# Patient Record
Sex: Female | Born: 1965 | Race: White | Hispanic: No | Marital: Married | Smoking: Current every day smoker
Health system: Southern US, Community
[De-identification: ages and names within clinical notes are randomized; demographics above are authoritative.]

## PROBLEM LIST (undated history)

## (undated) DIAGNOSIS — I639 Cerebral infarction, unspecified: Secondary | ICD-10-CM

## (undated) DIAGNOSIS — M1990 Unspecified osteoarthritis, unspecified site: Secondary | ICD-10-CM

## (undated) DIAGNOSIS — M5412 Radiculopathy, cervical region: Secondary | ICD-10-CM

## (undated) DIAGNOSIS — I10 Essential (primary) hypertension: Secondary | ICD-10-CM

## (undated) HISTORY — PX: ABDOMINAL HYSTERECTOMY: SHX81

## (undated) HISTORY — PX: TMJ ARTHROPLASTY: SHX1066

---

## 2014-05-28 ENCOUNTER — Encounter (HOSPITAL_BASED_OUTPATIENT_CLINIC_OR_DEPARTMENT_OTHER): Payer: Self-pay | Admitting: Emergency Medicine

## 2014-05-28 ENCOUNTER — Emergency Department (HOSPITAL_BASED_OUTPATIENT_CLINIC_OR_DEPARTMENT_OTHER)
Admission: EM | Admit: 2014-05-28 | Discharge: 2014-05-28 | Disposition: A | Discharge status: Home / Self Care | Payer: Self-pay | Attending: Emergency Medicine | Admitting: Emergency Medicine

## 2014-05-28 DIAGNOSIS — Z880 Allergy status to penicillin: Secondary | ICD-10-CM | POA: Insufficient documentation

## 2014-05-28 DIAGNOSIS — Z79899 Other long term (current) drug therapy: Secondary | ICD-10-CM | POA: Insufficient documentation

## 2014-05-28 DIAGNOSIS — Y9289 Other specified places as the place of occurrence of the external cause: Secondary | ICD-10-CM | POA: Insufficient documentation

## 2014-05-28 DIAGNOSIS — X58XXXA Exposure to other specified factors, initial encounter: Secondary | ICD-10-CM | POA: Insufficient documentation

## 2014-05-28 DIAGNOSIS — Z720 Tobacco use: Secondary | ICD-10-CM | POA: Insufficient documentation

## 2014-05-28 DIAGNOSIS — Y9389 Activity, other specified: Secondary | ICD-10-CM | POA: Insufficient documentation

## 2014-05-28 DIAGNOSIS — S39012A Strain of muscle, fascia and tendon of lower back, initial encounter: Secondary | ICD-10-CM | POA: Insufficient documentation

## 2014-05-28 DIAGNOSIS — I10 Essential (primary) hypertension: Secondary | ICD-10-CM | POA: Insufficient documentation

## 2014-05-28 HISTORY — DX: Essential (primary) hypertension: I10

## 2014-05-28 MED ORDER — OXYCODONE-ACETAMINOPHEN 5-325 MG PO TABS: 1.0000 | ORAL_TABLET | Freq: Four times a day (QID) | ORAL | Status: DC | PRN

## 2014-05-28 MED ORDER — CYCLOBENZAPRINE HCL 10 MG PO TABS
5.0000 mg | ORAL_TABLET | Freq: Once | ORAL | Status: AC
  Administered 2014-05-28: 5 mg via ORAL
  Filled 2014-05-28: qty 1

## 2014-05-28 MED ORDER — CYCLOBENZAPRINE HCL 5 MG PO TABS: 5.0000 mg | ORAL_TABLET | Freq: Three times a day (TID) | ORAL | Status: DC | PRN

## 2014-05-28 MED ORDER — HYDROCODONE-ACETAMINOPHEN 5-325 MG PO TABS
2.0000 | ORAL_TABLET | Freq: Once | ORAL | Status: AC
  Administered 2014-05-28: 2 via ORAL
  Filled 2014-05-28: qty 2

## 2014-05-28 NOTE — Discharge Instructions (Signed)
Back Pain, Adult Low back pain is very common. About 1 in 5 people have back pain.The cause of low back pain is rarely dangerous. The pain often gets better over time.About half of people with a sudden onset of back pain feel better in just 2 weeks. About 8 in 10 people feel better by 6 weeks.  CAUSES Some common causes of back pain include:  Strain of the muscles or ligaments supporting the spine.  Wear and tear (degeneration) of the spinal discs.  Arthritis.  Direct injury to the back. DIAGNOSIS Most of the time, the direct cause of low back pain is not known.However, back pain can be treated effectively even when the exact cause of the pain is unknown.Answering your caregiver's questions about your overall health and symptoms is one of the most accurate ways to make sure the cause of your pain is not dangerous. If your caregiver needs more information, he or she may order lab work or imaging tests (X-rays or MRIs).However, even if imaging tests show changes in your back, this usually does not require surgery. HOME CARE INSTRUCTIONS For many people, back pain returns.Since low back pain is rarely dangerous, it is often a condition that people can learn to manageon their own.   Remain active. It is stressful on the back to sit or stand in one place. Do not sit, drive, or stand in one place for more than 30 minutes at a time. Take short walks on level surfaces as soon as pain allows.Try to increase the length of time you walk each day.  Do not stay in bed.Resting more than 1 or 2 days can delay your recovery.  Do not avoid exercise or work.Your body is made to move.It is not dangerous to be active, even though your back may hurt.Your back will likely heal faster if you return to being active before your pain is gone.  Pay attention to your body when you bend and lift. Many people have less discomfortwhen lifting if they bend their knees, keep the load close to their bodies,and  avoid twisting. Often, the most comfortable positions are those that put less stress on your recovering back.  Find a comfortable position to sleep. Use a firm mattress and lie on your side with your knees slightly bent. If you lie on your back, put a pillow under your knees.  Only take over-the-counter or prescription medicines as directed by your caregiver. Over-the-counter medicines to reduce pain and inflammation are often the most helpful.Your caregiver may prescribe muscle relaxant drugs.These medicines help dull your pain so you can more quickly return to your normal activities and healthy exercise.  Put ice on the injured area.  Put ice in a plastic bag.  Place a towel between your skin and the bag.  Leave the ice on for 15-20 minutes, 03-04 times a day for the first 2 to 3 days. After that, ice and heat may be alternated to reduce pain and spasms.  Ask your caregiver about trying back exercises and gentle massage. This may be of some benefit.  Avoid feeling anxious or stressed.Stress increases muscle tension and can worsen back pain.It is important to recognize when you are anxious or stressed and learn ways to manage it.Exercise is a great option. SEEK MEDICAL CARE IF:  You have pain that is not relieved with rest or medicine.  You have pain that does not improve in 1 week.  You have new symptoms.  You are generally not feeling well. SEEK   IMMEDIATE MEDICAL CARE IF:   You have pain that radiates from your back into your legs.  You develop new bowel or bladder control problems.  You have unusual weakness or numbness in your arms or legs.  You develop nausea or vomiting.  You develop abdominal pain.  You feel faint. Document Released: 07/25/2005 Document Revised: 01/24/2012 Document Reviewed: 11/26/2013 [employer] Patient Information 2015 [employer]. This information is not intended to replace advice given to you by your health care provider. Make sure you  discuss any questions you have with your health care provider.  

## 2014-05-28 NOTE — ED Notes (Signed)
Pt amb to triage with slow steady gait, reporting low back pain radiating down her left leg since moving a television and stand today.

## 2014-05-28 NOTE — ED Provider Notes (Signed)
CSN: [id number]     Arrival date & time 05/28/14  1827 History   First MD Initiated Contact with Patient 05/28/14 1845     Chief Complaint  Patient presents with  . Back Pain     (Consider location/radiation/quality/duration/timing/severity/associated sxs/prior Treatment) Patient is a 48 y.o. female presenting with back pain.  Back Pain Location:  Lumbar spine Quality:  Shooting Radiates to:  L posterior upper leg Pain severity:  Severe Onset quality:  Sudden Duration:  4 hours Timing:  Constant Progression:  Worsening Chronicity:  Recurrent Context comment:  Trying to lift a heavy TV. Relieved by:  Being still Worsened by:  Movement Ineffective treatments:  Ibuprofen (Acetaminophen) Associated symptoms: no bladder incontinence, no bowel incontinence, no dysuria, no fever, no numbness, no perianal numbness and no weakness     Past Medical History  Diagnosis Date  . Hypertension    History reviewed. No pertinent past surgical history. History reviewed. No pertinent family history. History  Substance Use Topics  . Smoking status: Current Every Day Smoker  . Smokeless tobacco: Not on file  . Alcohol Use: Not on file   OB History   Grav Para Term Preterm Abortions TAB SAB Ect Mult Living                 Review of Systems  Constitutional: Negative for fever.  Gastrointestinal: Negative for bowel incontinence.  Genitourinary: Negative for bladder incontinence and dysuria.  Musculoskeletal: Positive for back pain.  Neurological: Negative for weakness and numbness.  All other systems reviewed and are negative.     Allergies  Penicillins  Home Medications   Prior to Admission medications   Medication Sig Start Date End Date Taking? Authorizing Provider  metoprolol (LOPRESSOR) 50 MG tablet Take 50 mg by mouth 2 (two) times daily.   Yes Historical Provider, MD  cyclobenzaprine (FLEXERIL) 5 MG tablet Take 1 tablet (5 mg total) by mouth 3 (three) times daily as  needed for muscle spasms. 05/28/14   Warnell Foresterrey Shawana Knoch, MD  oxyCODONE-acetaminophen (PERCOCET/ROXICET) 5-325 MG per tablet Take 1-2 tablets by mouth every 6 (six) hours as needed for severe pain. 05/28/14   Warnell Foresterrey Lakendrick Paradis, MD   BP 187/101  Pulse 79  Temp(Src) 98.2 F (36.8 C) (Oral)  Resp 18  SpO2 97% Physical Exam  Nursing note and vitals reviewed. Constitutional: She is oriented to person, place, and time. She appears well-developed and well-nourished. No distress.  HENT:  Head: Normocephalic and atraumatic.  Eyes: Conjunctivae are normal. No scleral icterus.  Neck: Neck supple.  Cardiovascular: Normal rate and intact distal pulses.   Pulmonary/Chest: Effort normal. No stridor. No respiratory distress.  Abdominal: Normal appearance. She exhibits no distension.  Musculoskeletal:       Lumbar back: She exhibits no bony tenderness.       Back:  Neurological: She is alert and oriented to person, place, and time.  Reflex Scores:      Patellar reflexes are 2+ on the right side and 2+ on the left side. Normal lower extremity strength. Antalgic gait.  Skin: Skin is warm and dry. No rash noted.  Psychiatric: She has a normal mood and affect. Her behavior is normal.    ED Course  Procedures (including critical care time) Labs Review Labs Reviewed - No data to display  Imaging Review No results found.   EKG Interpretation None      MDM   Final diagnoses:  Low back strain, initial encounter    [age] year old female with  back pain which started suddenly while she was trying to lift a heavy object. History and exam consistent with back strain. No red flag signs or symptoms to suggest spinal cord pathology. Plan symptomatic treatment with muscle relaxers and NSAIDs. Oxycodone for breakthrough.  BP found to be elevated. No symptoms of endorgan damage. Likely partially due to pain. Advised followup.    Warnell Foresterrey Kadeen Sroka, MD 05/28/14 [id number]

## 2014-06-01 ENCOUNTER — Encounter (HOSPITAL_BASED_OUTPATIENT_CLINIC_OR_DEPARTMENT_OTHER): Payer: Self-pay | Admitting: Emergency Medicine

## 2014-06-01 ENCOUNTER — Emergency Department (HOSPITAL_BASED_OUTPATIENT_CLINIC_OR_DEPARTMENT_OTHER)
Admission: EM | Admit: 2014-06-01 | Discharge: 2014-06-01 | Disposition: A | Discharge status: Home / Self Care | Payer: Self-pay | Attending: Emergency Medicine | Admitting: Emergency Medicine

## 2014-06-01 DIAGNOSIS — Z880 Allergy status to penicillin: Secondary | ICD-10-CM | POA: Insufficient documentation

## 2014-06-01 DIAGNOSIS — I10 Essential (primary) hypertension: Secondary | ICD-10-CM | POA: Insufficient documentation

## 2014-06-01 DIAGNOSIS — Z79899 Other long term (current) drug therapy: Secondary | ICD-10-CM | POA: Insufficient documentation

## 2014-06-01 DIAGNOSIS — M2550 Pain in unspecified joint: Secondary | ICD-10-CM | POA: Insufficient documentation

## 2014-06-01 DIAGNOSIS — Z720 Tobacco use: Secondary | ICD-10-CM | POA: Insufficient documentation

## 2014-06-01 MED ORDER — TRAMADOL HCL 50 MG PO TABS: 50.0000 mg | ORAL_TABLET | Freq: Four times a day (QID) | ORAL | Status: DC | PRN

## 2014-06-01 MED ORDER — METOPROLOL TARTRATE 50 MG PO TABS: 50.0000 mg | ORAL_TABLET | Freq: Two times a day (BID) | ORAL | Status: DC

## 2014-06-01 NOTE — Discharge Instructions (Signed)
Arthralgia °Your caregiver has diagnosed you as suffering from an arthralgia. Arthralgia means there is pain in a joint. This can come from many reasons including: °· Bruising the joint which causes soreness (inflammation) in the joint. °· Wear and tear on the joints which occur as we grow older (osteoarthritis). °· Overusing the joint. °· Various forms of arthritis. °· Infections of the joint. °Regardless of the cause of pain in your joint, most of these different pains respond to anti-inflammatory drugs and rest. The exception to this is when a joint is infected, and these cases are treated with antibiotics, if it is a bacterial infection. °HOME CARE INSTRUCTIONS  °· Rest the injured area for as long as directed by your caregiver. Then slowly start using the joint as directed by your caregiver and as the pain allows. Crutches as directed may be useful if the ankles, knees or hips are involved. If the knee was splinted or casted, continue use and care as directed. If an stretchy or elastic wrapping bandage has been applied today, it should be removed and re-applied every 3 to 4 hours. It should not be applied tightly, but firmly enough to keep swelling down. Watch toes and feet for swelling, bluish discoloration, coldness, numbness or excessive pain. If any of these problems (symptoms) occur, remove the ace bandage and re-apply more loosely. If these symptoms persist, contact your caregiver or return to this location. °· For the first 24 hours, keep the injured extremity elevated on pillows while lying down. °· Apply ice for 15-20 minutes to the sore joint every couple hours while awake for the first half day. Then 03-04 times per day for the first 48 hours. Put the ice in a plastic bag and place a towel between the bag of ice and your skin. °· Wear any splinting, casting, elastic bandage applications, or slings as instructed. °· Only take over-the-counter or prescription medicines for pain, discomfort, or fever as  directed by your caregiver. Do not use aspirin immediately after the injury unless instructed by your physician. Aspirin can cause increased bleeding and bruising of the tissues. °· If you were given crutches, continue to use them as instructed and do not resume weight bearing on the sore joint until instructed. °Persistent pain and inability to use the sore joint as directed for more than 2 to 3 days are warning signs indicating that you should see a caregiver for a follow-up visit as soon as possible. Initially, a hairline fracture (break in bone) may not be evident on X-rays. Persistent pain and swelling indicate that further evaluation, non-weight bearing or use of the joint (use of crutches or slings as instructed), or further X-rays are indicated. X-rays may sometimes not show a small fracture until a week or 10 days later. Make a follow-up appointment with your own caregiver or one to whom we have referred you. A radiologist (specialist in reading X-rays) may read your X-rays. Make sure you know how you are to obtain your X-ray results. Do not assume everything is normal if you do not hear from us. °SEEK MEDICAL CARE IF: °Bruising, swelling, or pain increases. °SEEK IMMEDIATE MEDICAL CARE IF:  °· Your fingers or toes are numb or blue. °· The pain is not responding to medications and continues to stay the same or get worse. °· The pain in your joint becomes severe. °· You develop a fever over 102° F (38.9° C). °· It becomes impossible to move or use the joint. °MAKE SURE YOU:  °·   Understand these instructions.  Will watch your condition.  Will get help right away if you are not doing well or get worse. Document Released: 07/25/2005 Document Revised: 10/17/2011 Document Reviewed: 03/12/2008 [facility] Patient Information 2015 [place], [place]. This information is not intended to replace advice given to you by your health care provider. Make sure you discuss any questions you have with your health care  provider.  Hypertension Hypertension, commonly called high blood pressure, is when the force of blood pumping through your arteries is too strong. Your arteries are the blood vessels that carry blood from your heart throughout your body. A blood pressure reading consists of a higher number over a lower number, such as 110/72. The higher number (systolic) is the pressure inside your arteries when your heart pumps. The lower number (diastolic) is the pressure inside your arteries when your heart relaxes. Ideally you want your blood pressure below 120/80. Hypertension forces your heart to work harder to pump blood. Your arteries may become narrow or stiff. Having hypertension puts you at risk for heart disease, stroke, and other problems.  RISK FACTORS Some risk factors for high blood pressure are controllable. Others are not.  Risk factors you cannot control include:   Race. You may be at higher risk if you are African American.  Age. Risk increases with age.  Gender. Men are at higher risk than women before age 34 years. After age 91, women are at higher risk than men. Risk factors you can control include:  Not getting enough exercise or physical activity.  Being overweight.  Getting too much fat, sugar, calories, or salt in your diet.  Drinking too much alcohol. SIGNS AND SYMPTOMS Hypertension does not usually cause signs or symptoms. Extremely high blood pressure (hypertensive crisis) may cause headache, anxiety, shortness of breath, and nosebleed. DIAGNOSIS  To check if you have hypertension, your health care provider will measure your blood pressure while you are seated, with your arm held at the level of your heart. It should be measured at least twice using the same arm. Certain conditions can cause a difference in blood pressure between your right and left arms. A blood pressure reading that is higher than normal on one occasion does not mean that you need treatment. If one blood  pressure reading is high, ask your health care provider about having it checked again. TREATMENT  Treating high blood pressure includes making lifestyle changes and possibly taking medicine. Living a healthy lifestyle can help lower high blood pressure. You may need to change some of your habits. Lifestyle changes may include:  Following the DASH diet. This diet is high in fruits, vegetables, and whole grains. It is low in salt, red meat, and added sugars.  Getting at least 2 hours of brisk physical activity every week.  Losing weight if necessary.  Not smoking.  Limiting alcoholic beverages.  Learning ways to reduce stress. If lifestyle changes are not enough to get your blood pressure under control, your health care provider may prescribe medicine. You may need to take more than one. Work closely with your health care provider to understand the risks and benefits. HOME CARE INSTRUCTIONS  Have your blood pressure rechecked as directed by your health care provider.   Take medicines only as directed by your health care provider. Follow the directions carefully. Blood pressure medicines must be taken as prescribed. The medicine does not work as well when you skip doses. Skipping doses also puts you at risk for problems.  Do not smoke.   Monitor your blood pressure at home as directed by your health care provider. SEEK MEDICAL CARE IF:   You think you are having a reaction to medicines taken.  You have recurrent headaches or feel dizzy.  You have swelling in your ankles.  You have trouble with your vision. SEEK IMMEDIATE MEDICAL CARE IF:  You develop a severe headache or confusion.  You have unusual weakness, numbness, or feel faint.  You have severe chest or abdominal pain.  You vomit repeatedly.  You have trouble breathing. MAKE SURE YOU:   Understand these instructions.  Will watch your condition.  Will get help right away if you are not doing well or get  worse. Document Released: 07/25/2005 Document Revised: 12/09/2013 Document Reviewed: 05/17/2013 [facility] Patient Information 2015 [place], [place]. This information is not intended to replace advice given to you by your health care provider. Make sure you discuss any questions you have with your health care provider.   Emergency Department Resource Guide 1) Find a Doctor and Pay Out of Pocket Although you won't have to find out who is covered by your insurance plan, it is a good idea to ask around and get recommendations. You will then need to call the office and see if the doctor you have chosen will accept you as a new patient and what types of options they offer for patients who are self-pay. Some doctors offer discounts or will set up payment plans for their patients who do not have insurance, but you will need to ask so you aren't surprised when you get to your appointment.  2) Contact Your Local Health Department Not all health departments have doctors that can see patients for sick visits, but many do, so it is worth a call to see if yours does. If you don't know where your local health department is, you can check in your phone book. The CDC also has a tool to help you locate your state's health department, and many state websites also have listings of all of their local health departments.  3) Find a Walk-in Clinic If your illness is not likely to be very severe or complicated, you may want to try a walk in clinic. These are popping up all over the country in pharmacies, drugstores, and shopping centers. They're usually staffed by nurse practitioners or physician assistants that have been trained to treat common illnesses and complaints. They're usually fairly quick and inexpensive. However, if you have serious medical issues or chronic medical problems, these are probably not your best option.  No Primary Care Doctor: - Call Health Connect at  [phone] - they can help you locate a primary care  doctor that  accepts your insurance, provides certain services, etc. - Physician Referral Service- [phone]  Chronic Pain Problems: Organization         Address  Phone   Notes  [facility]  [phone]) [phone] Patients need to be referred by their primary care doctor.   Medication Assistance: Organization         Address  Phone   Notes  [facility] Medication [facility] [address] [place], [place] [id number] [phone]) [phone] --Must be a resident of [facility] -- Must have NO insurance coverage whatsoever (no Medicaid/ Medicare, etc.) -- The pt. MUST have a primary care doctor that directs their care regularly and follows them in the community   [facility]ist  [phone]   [address]  (  [phone]) [phone]    Agencies that provide inexpensive medical care: Organization         Address  Phone   Notes  [facility]  [phone]) [phone]   [facility]    [phone](336) [phone]   [facility] [address] [place], [place] [address] [phone]) [phone]   [facility] [address], [place] [phone](336) [phone]   [facility]anned Parenthood    [phone](336) [phone]   [facility]    [phone](336) [phone]   [facility]  [address], [place] Phone:  [phone](336) [phone], Fax:  [phone]) [phone] Hours of Operation:  9 am - 6 pm, M-F.  Also accepts Medicaid/Medicare and self-pay.  [facility]  [address], [place] Phone: [phone](336) [phone], Fax: [phone](336) [phone]. Hours of Operation:  8:30 am - 5:30 pm, M-F.  Also accepts Medicaid and self-pay.  [facility] [place] [address], [place] Phone: [phone]) [phone]   [facility] [address], [place], [place] [phone], Ext. [phone] Mondays & Thursdays: 7-9 AM.  First 15 patients are seen on a first come, first serve basis.    Medicaid-accepting [facility]  Providers:  Organization         Address  Phone   Notes  [facility] [address], [place] [phone]) [phone] Also accepts self-pay patients.  [facility] [address], [place]  [phone]) [phone]   [facility] [address], [place] [phone]) [phone]   [facility] [address] [place] [address], [place] [phone]) [phone]   Renaye RakersVeita Bland [address], [place]   [phone]) [phone] Only accepts [place] Access [place] patients after they have their name applied to their card.   Self-Pay (no insurance) in [facility] [place]:  Organization         Address  Phone   Notes  Sickle Cell Patients, [facility] [address], [place] [phone]) [phone]   [facility] [address], [place] [phone]) [phone]   [facility] Joes  [address], [place] [phone]) [phone]   [facility]/Dr. Osei-Bonsu  [address] [place] [address], [place] or [phone], [place] [phone]) [phone] Phone number for both [place] and [place] locations is the same.  [facility] [address], [place] [phone]) [phone]   [facility] [place] [address] [place] [address], [place] or [address] [phone]) [phone]   [facility] [address], [place] [phone](336) [phone], phone; [phone](336) [phone], fax Sees patients 1st and 3rd Saturday of every month.  Must not qualify for public or private insurance (i.e. Medicaid, Medicare, [place] Health Choice, Veterans' Benefits)  Household income should be no more than 200% of the poverty level The clinic cannot treat you if you are pregnant or think you are pregnant  Sexually transmitted diseases are not treated at the clinic.   Dental Care: Organization         Address  Phone  Notes  [facility] Department of [facility] [address], [place] [phone]) [phone] Accepts children up to age 48 who are enrolled in [place] or [place] Health Choice; pregnant women with a Medicaid card; and children who have  applied for Medicaid or [place] Health Choice, but were declined, whose parents can pay a reduced fee at time of service.  [facility] Department of [facility]  [address], [place] [phone] Accepts children up to age 57 who are enrolled in [place] or [place] Health Choice; pregnant women with a Medicaid card; and children who have applied for Medicaid or [place] Health Choice, but were declined, whose parents can pay a reduced fee at time of service.  [facility]  [address] [place], [place] [phone] Patients are seen by appointment only. Walk-ins are not accepted. [facility] will see patients 43 years of age and older. Monday - Tuesday (8am-5pm) Most Wednesdays (8:30-5pm) $30 per visit, cash only  [facility] Access [facility]  [address], [facility] [phone] Patients are seen by appointment only. Walk-ins are not accepted. [facility] will see patients 44 years of age and older. One Wednesday Evening (Monthly: Volunteer Based).  $30 per visit, cash only  [employer]  [phone] for adults; Children under age 37, call Graduate Pediatric Dentistry at [phone]. Children aged 21-14, please call [phone] to request a pediatric application.  Dental services are provided in all areas of dental care including fillings, crowns and bridges, complete and partial dentures, implants, gum treatment, root canals, and extractions. Preventive care is also provided. Treatment is provided to both adults and children. Patients are selected via a lottery and there is often a waiting list.   [facility] [address], [place]  [phone] [email or web address]   [facility]  [address] [place], [place] [phone], Ext. [phone] Second and Fourth Thursday of each month, opens at 6:30 AM; Clinic ends at 9 AM.  Patients are seen on a first-come first-served basis, and a limited number are seen during each clinic.   [facility]  [address] [place], [place] [phone]   Eligibility Requirements You must have lived in [place], [place], or [place] counties for at least the last three months.   You cannot be eligible for state or federal sponsored [place], including [employer], [place], or [employer].   You generally cannot be eligible for healthcare insurance through your employer.    How to apply: Eligibility screenings are held every Tuesday and Wednesday afternoon from 1:00 pm until 4:00 pm. You do not need an appointment for the interview!  [facility] [address] [place], [place] [phone]   [facility] Health Department  [phone]   [facility] Health Department  [phone]   [facility] Health Department  [phone]    Behavioral Health Resources in the Community: Intensive Outpatient Programs Organization         Address  Phone  Notes  [facility] Services [address], [place], [place] [phone]   [facility] [address], [place], [place] [phone]   [facility] [address] [place], [place]  [phone]   [facility] Mental Health [address],  [place], [place] [phone] or [phone]   Substance Abuse Resources Organization         Address  Phone  Notes  Alcohol [facility] Drug Services  [phone]   [facility]  [phone]   [facility] [place]  [phone]   Floydene Flock  [phone]   Residential & Outpatient Substance Abuse  Program  [phone]   Psychological Services Organization         Address  Phone  Notes  [facility] Behavioral Health  [phone]    [facility]  [phone]   [facility] Mental Health [phone] [address] [place] [phone] or [phone]    Mobile Crisis Teams Organization         Address  Phone  Notes  [facility], Mobile Crisis Care Unit  [phone]   [facility]  [address] [place], [place] [phone]   Doristine Locks [address] [place] [phone]    Self-Help/Support Groups Organization         Address  Phone             Notes  [facility] of [place] - variety of support groups  [phone]- I7437963 Call for more information  Narcotics Anonymous (NA), [facility] [address], [employer] [place]  2 meetings at this location   Statistician         Address  Phone  Notes  [facility] [address],    [place]  [phone]   [facility]  [address] [place] [address]32, [place], [place] [phone]   [facility] [address] [place], [place] [phone] Admissions: 8am-3pm M-F  [facility] [address],    [place], [place] [phone]   [facility] [address] [place], [place], [place] [phone]   [facility] [address],  [place], [place] [phone]   [facility] - Intensive Outpatient [address], [place], [place] [phone]   [facility] ([facility] [address]  [place], [place] [phone] or [phone]   [facility] ([facility]) [address], [place], [place] [phone] Accepts Medicaid  [facility] [address],  [place] [phone] Substance Abuse/Addiction Treatment   [facility] Organization         Address  Phone  Notes  [facility]  [phone]   Angie Fava, PhD [address] Knack [place], [place]   [phone] or [phone]    [facility]   [address] [place], [place] [phone]   [facility] [address], [place], [place] [phone] Insurance/Medicaid/sponsorship through [facility] and Families [address]                                    [place], [place] [phone] Therapy/tele-psych/case  [facility] [address], [place] [phone]    Dr. Lolly Mustache  [phone]   [facility] [place]  [employer] [facility] Dept. 1) [address],  2) [address] [place] 3)  [address], [place] [phone]  [phone]   [facility] Child Abuse Hotline 940-381-7898 or [phone] (After Hours)

## 2014-06-01 NOTE — ED Provider Notes (Signed)
CSN: [id number]     Arrival date & time 06/01/14  1042 History   First MD Initiated Contact with Patient 06/01/14 1129     Chief Complaint  Patient presents with  . Generalized Body Aches     (Consider location/radiation/quality/duration/timing/severity/associated sxs/prior Treatment) HPI Comments: Patient presents with joint pains. She has a history of arthritis and has pain in her neck and her knees. She states over the last week she's had pain everywhere. She hurts in all her joints. She denies any fevers or joint swelling. She denies any rashes. She denies any flulike symptoms. She denies any runny nose or nasal congestion. She denies any myalgias. She's been taking over-the-counter medications without relief.   Past Medical History  Diagnosis Date  . Hypertension    History reviewed. No pertinent past surgical history. No family history on file. History  Substance Use Topics  . Smoking status: Current Every Day Smoker  . Smokeless tobacco: Not on file  . Alcohol Use: Not on file   OB History   Grav Para Term Preterm Abortions TAB SAB Ect Mult Living                 Review of Systems  Constitutional: Negative for fever, chills, diaphoresis and fatigue.  HENT: Negative for congestion, rhinorrhea and sneezing.   Eyes: Negative.   Respiratory: Negative for cough, chest tightness and shortness of breath.   Cardiovascular: Negative for chest pain and leg swelling.  Gastrointestinal: Negative for nausea, vomiting, abdominal pain, diarrhea and blood in stool.  Genitourinary: Negative for frequency, hematuria, flank pain and difficulty urinating.  Musculoskeletal: Positive for arthralgias. Negative for back pain.  Skin: Negative for rash.  Neurological: Negative for dizziness, speech difficulty, weakness, numbness and headaches.      Allergies  Penicillins  Home Medications   Prior to Admission medications   Medication Sig Start Date End Date Taking? Authorizing  Provider  metoprolol (LOPRESSOR) 50 MG tablet Take 50 mg by mouth 2 (two) times daily.   Yes Historical Provider, MD  metoprolol (LOPRESSOR) 50 MG tablet Take 1 tablet (50 mg total) by mouth 2 (two) times daily. 06/01/14   Rolan BuccoMelanie Brianne Maina, MD  traMADol (ULTRAM) 50 MG tablet Take 1 tablet (50 mg total) by mouth every 6 (six) hours as needed. 06/01/14   Rolan BuccoMelanie Merri Dimaano, MD   BP 199/100  Pulse 74  Temp(Src) 97.8 F (36.6 C) (Oral)  Resp 17  Ht 5' 3.5" (1.613 m)  Wt 179 lb (81.194 kg)  BMI 31.21 kg/m2  SpO2 100% Physical Exam  Constitutional: She is oriented to person, place, and time. She appears well-developed and well-nourished.  HENT:  Head: Normocephalic and atraumatic.  Eyes: Pupils are equal, round, and reactive to light.  Neck: Normal range of motion. Neck supple.  Cardiovascular: Normal rate, regular rhythm and normal heart sounds.   Pulmonary/Chest: Effort normal and breath sounds normal. No respiratory distress. She has no wheezes. She has no rales. She exhibits no tenderness.  Abdominal: Soft. Bowel sounds are normal. There is no tenderness. There is no rebound and no guarding.  Musculoskeletal: Normal range of motion. She exhibits no edema.  There is no focal areas of erythema or swelling of the joints.  Lymphadenopathy:    She has no cervical adenopathy.  Neurological: She is alert and oriented to person, place, and time.  Skin: Skin is warm and dry. No rash noted.  Psychiatric: She has a normal mood and affect.    ED Course  Procedures (including critical care time) Labs Review Labs Reviewed - No data to display  Imaging Review No results found.   EKG Interpretation   Date/Time:  Sunday June 01 2014 11:05:40 EDT Ventricular Rate:  85 PR Interval:  206 QRS Duration: 72 QT Interval:  388 QTC Calculation: 461 R Axis:   30 Text Interpretation:  Normal sinus rhythm Normal ECG No old tracing to  compare Confirmed by Lucio Litsey  MD, Samon Dishner ([id number]) on 06/01/2014  11:09:39 AM      MDM   Final diagnoses:  Joint pain  Essential hypertension    Patient with polyarthralgias. I don't see any signs of septic joints. She does not look systemically ill. She was seen here 3 days ago and got a prescription for oxycodone for back pain. She's had multiple prescriptions for narcotic medicines delivered last few months. She was given a prescription for Ultram to use at home for pain. She was encouraged to follow-up with her primary care physician regarding both her elevated blood pressure and her multiple joint pains. She may need further testing for other causes such as rheumatoid arthritis or lupus. Her blood pressure was markedly elevated in the ED. She is currently asymptomatic from that. She states she was previously on Lopressor but is out of her medication. I gave her a prescription for her Lopressor.    Rolan BuccoMelanie Bearett Porcaro, MD 06/01/14 [phone]

## 2014-06-01 NOTE — ED Notes (Signed)
Pt discharged to home with family. NAD.  

## 2014-06-01 NOTE — ED Notes (Addendum)
Patient here with general body aches x 1 day, denies trauma. Reports that she has been out of her BP meds x 3 days. Taking tylenol and ibuprofen with minimal relief. Has had cough for several weeks. Also complains of chest tightness and neck pain.

## 2014-06-15 ENCOUNTER — Emergency Department (HOSPITAL_BASED_OUTPATIENT_CLINIC_OR_DEPARTMENT_OTHER)
Admission: EM | Admit: 2014-06-15 | Discharge: 2014-06-15 | Disposition: A | Discharge status: Home / Self Care | Payer: Self-pay | Attending: Emergency Medicine | Admitting: Emergency Medicine

## 2014-06-15 ENCOUNTER — Encounter (HOSPITAL_BASED_OUTPATIENT_CLINIC_OR_DEPARTMENT_OTHER): Payer: Self-pay | Admitting: *Deleted

## 2014-06-15 DIAGNOSIS — M62838 Other muscle spasm: Secondary | ICD-10-CM | POA: Insufficient documentation

## 2014-06-15 DIAGNOSIS — I10 Essential (primary) hypertension: Secondary | ICD-10-CM | POA: Insufficient documentation

## 2014-06-15 DIAGNOSIS — Z880 Allergy status to penicillin: Secondary | ICD-10-CM | POA: Insufficient documentation

## 2014-06-15 DIAGNOSIS — G8929 Other chronic pain: Secondary | ICD-10-CM | POA: Insufficient documentation

## 2014-06-15 DIAGNOSIS — Z720 Tobacco use: Secondary | ICD-10-CM | POA: Insufficient documentation

## 2014-06-15 DIAGNOSIS — Z79899 Other long term (current) drug therapy: Secondary | ICD-10-CM | POA: Insufficient documentation

## 2014-06-15 DIAGNOSIS — M1990 Unspecified osteoarthritis, unspecified site: Secondary | ICD-10-CM | POA: Insufficient documentation

## 2014-06-15 DIAGNOSIS — M25511 Pain in right shoulder: Secondary | ICD-10-CM | POA: Insufficient documentation

## 2014-06-15 DIAGNOSIS — Z8673 Personal history of transient ischemic attack (TIA), and cerebral infarction without residual deficits: Secondary | ICD-10-CM | POA: Insufficient documentation

## 2014-06-15 DIAGNOSIS — M542 Cervicalgia: Secondary | ICD-10-CM | POA: Insufficient documentation

## 2014-06-15 HISTORY — DX: Cerebral infarction, unspecified: I63.9

## 2014-06-15 HISTORY — DX: Unspecified osteoarthritis, unspecified site: M19.90

## 2014-06-15 MED ORDER — CYCLOBENZAPRINE HCL 10 MG PO TABS
5.0000 mg | ORAL_TABLET | Freq: Once | ORAL | Status: AC
  Administered 2014-06-15: 5 mg via ORAL
  Filled 2014-06-15: qty 1

## 2014-06-15 MED ORDER — CYCLOBENZAPRINE HCL 10 MG PO TABS: 10.0000 mg | ORAL_TABLET | Freq: Two times a day (BID) | ORAL | Status: DC | PRN

## 2014-06-15 MED ORDER — NAPROXEN 500 MG PO TABS: 500.0000 mg | ORAL_TABLET | Freq: Two times a day (BID) | ORAL | Status: DC

## 2014-06-15 MED ORDER — OXYCODONE-ACETAMINOPHEN 5-325 MG PO TABS
1.0000 | ORAL_TABLET | Freq: Once | ORAL | Status: AC
  Administered 2014-06-15: 1 via ORAL
  Filled 2014-06-15: qty 1

## 2014-06-15 NOTE — ED Provider Notes (Signed)
CSN: [id number]     Arrival date & time 06/15/14  1919 History   First MD Initiated Contact with Patient 06/15/14 1931     Chief Complaint  Patient presents with  . Neck Pain     (Consider location/radiation/quality/duration/timing/severity/associated sxs/prior Treatment) Patient is a 48 y.o. female presenting with neck pain. The history is provided by the patient.  Neck Pain Pain location:  R side Quality:  Shooting Pain radiates to:  R shoulder Pain severity:  Severe Pain is:  Same all the time Onset quality:  Gradual Duration:  5 days Timing:  Constant Progression:  Worsening Chronicity:  Chronic Relieved by:  Nothing Worsened by:  Bending Ineffective treatments:  NSAIDs Associated symptoms: no bladder incontinence and no bowel incontinence    Olivia Webster is a 48 y.o. female who presents to the ED with neck pain. The pain is chronic but has been worse than usual over the past 5 days. She has taken ibuprofen and tylenol without relief. She states she does not have a doctor right now but will have insurance with the next 90 days and will get one.   Past Medical History  Diagnosis Date  . Hypertension   . Arthritis   . Stroke    Past Surgical History  Procedure Laterality Date  . Abdominal hysterectomy    . Tmj arthroplasty     No family history on file. History  Substance Use Topics  . Smoking status: Current Every Day Smoker    Types: Cigarettes  . Smokeless tobacco: Never Used  . Alcohol Use: No   OB History    No data available     Review of Systems  Gastrointestinal: Negative for bowel incontinence.  Genitourinary: Negative for bladder incontinence.  Musculoskeletal: Positive for neck pain.       Right shoulder pain  all other systems negative.     Allergies  Penicillins  Home Medications   Prior to Admission medications   Medication Sig Start Date End Date Taking? Authorizing Provider  metoprolol (LOPRESSOR) 50 MG tablet Take 1 tablet (50  mg total) by mouth 2 (two) times daily. 06/01/14  Yes Rolan BuccoMelanie Belfi, MD  traMADol (ULTRAM) 50 MG tablet Take 1 tablet (50 mg total) by mouth every 6 (six) hours as needed. 06/01/14  Yes Rolan BuccoMelanie Belfi, MD  metoprolol (LOPRESSOR) 50 MG tablet Take 50 mg by mouth 2 (two) times daily.    Historical Provider, MD   BP 202/97 mmHg  Pulse 74  Temp(Src) 97.9 F (36.6 C) (Oral)  Resp 20  Ht 5\' 3"  (1.6 m)  Wt 179 lb (81.194 kg)  BMI 31.72 kg/m2  SpO2 99% Physical Exam  Constitutional: She is oriented to person, place, and time. She appears well-developed and well-nourished.  HENT:  Head: Normocephalic and atraumatic.  Eyes: EOM are normal.  Neck: Neck supple.  Cardiovascular: Normal rate.   Pulmonary/Chest: Effort normal.  Abdominal: Soft. There is no tenderness.  Musculoskeletal: Normal range of motion.       Cervical back: She exhibits tenderness and spasm. She exhibits no deformity, no laceration and normal pulse. Decreased range of motion: due to pain.       Back:  Radial pulses equal, adequate circulation, good touch sensation.   Neurological: She is alert and oriented to person, place, and time. She has normal strength. No cranial nerve deficit or sensory deficit. Gait normal.  Reflex Scores:      Bicep reflexes are 2+ on the right side and 2+  on the left side.      Brachioradialis reflexes are 2+ on the right side and 2+ on the left side.      Patellar reflexes are 2+ on the right side and 2+ on the left side. Skin: Skin is warm and dry.  Psychiatric: She has a normal mood and affect. Her behavior is normal.  Nursing note and vitals reviewed.   ED Course  Procedures   MDM  48 y.o. female with hx of chronic neck and back pain. Here is increased neck pain over the past few days. Muscle spasm of right side of neck. Will treat for muscle spasm. Discussed with the patient and all questioned fully answered.    Medication List    TAKE these medications        cyclobenzaprine 10 MG  tablet  Commonly known as:  FLEXERIL  Take 1 tablet (10 mg total) by mouth 2 (two) times daily as needed for muscle spasms.     naproxen 500 MG tablet  Commonly known as:  NAPROSYN  Take 1 tablet (500 mg total) by mouth 2 (two) times daily.      ASK your doctor about these medications        metoprolol 50 MG tablet  Commonly known as:  LOPRESSOR  Take 50 mg by mouth 2 (two) times daily.     metoprolol 50 MG tablet  Commonly known as:  LOPRESSOR  Take 1 tablet (50 mg total) by mouth 2 (two) times daily.     traMADol 50 MG tablet  Commonly known as:  ULTRAM  Take 1 tablet (50 mg total) by mouth every 6 (six) hours as needed.           [id number]Hope Dickerson CityM Annisa Mazzarella, NP 06/15/14 2356  Gilda Creasehristopher J. Pollina, MD 06/19/14 [phone]

## 2014-06-15 NOTE — ED Notes (Signed)
Reports history of arthritis in her neck- worse since yesterday

## 2014-07-14 ENCOUNTER — Emergency Department (HOSPITAL_COMMUNITY)
Admission: EM | Admit: 2014-07-14 | Discharge: 2014-07-14 | Discharge status: Against Medical Advice | Payer: Self-pay | Attending: Emergency Medicine | Admitting: Emergency Medicine

## 2014-07-14 ENCOUNTER — Encounter (HOSPITAL_COMMUNITY): Payer: Self-pay | Admitting: *Deleted

## 2014-07-14 DIAGNOSIS — I10 Essential (primary) hypertension: Secondary | ICD-10-CM | POA: Insufficient documentation

## 2014-07-14 DIAGNOSIS — R21 Rash and other nonspecific skin eruption: Secondary | ICD-10-CM | POA: Insufficient documentation

## 2014-07-14 DIAGNOSIS — Z720 Tobacco use: Secondary | ICD-10-CM | POA: Insufficient documentation

## 2014-07-14 NOTE — ED Notes (Signed)
Pt no longer in room, pt eloped Pa notified.

## 2014-07-14 NOTE — ED Notes (Signed)
Pt in c/o possible abscess to top of her head, denies drainage, no distress noted

## 2014-07-15 NOTE — ED Provider Notes (Signed)
Olivia Webster is a 48 y.o. female complaining of rash. Patient left without being seen after triage. I did not participate in the care of this patient.  Wynetta Emeryicole Lashana Spang, PA-C 07/15/14 [id number]  Elwin MochaBlair Walden, MD 07/15/14 [phone]

## 2014-07-27 ENCOUNTER — Emergency Department (HOSPITAL_BASED_OUTPATIENT_CLINIC_OR_DEPARTMENT_OTHER)
Admission: EM | Admit: 2014-07-27 | Discharge: 2014-07-27 | Disposition: A | Discharge status: Home / Self Care | Payer: Self-pay | Attending: Emergency Medicine | Admitting: Emergency Medicine

## 2014-07-27 ENCOUNTER — Encounter (HOSPITAL_BASED_OUTPATIENT_CLINIC_OR_DEPARTMENT_OTHER): Payer: Self-pay | Admitting: *Deleted

## 2014-07-27 ENCOUNTER — Emergency Department (HOSPITAL_BASED_OUTPATIENT_CLINIC_OR_DEPARTMENT_OTHER): Payer: Self-pay

## 2014-07-27 DIAGNOSIS — M1990 Unspecified osteoarthritis, unspecified site: Secondary | ICD-10-CM | POA: Insufficient documentation

## 2014-07-27 DIAGNOSIS — W01198A Fall on same level from slipping, tripping and stumbling with subsequent striking against other object, initial encounter: Secondary | ICD-10-CM | POA: Insufficient documentation

## 2014-07-27 DIAGNOSIS — S8992XA Unspecified injury of left lower leg, initial encounter: Secondary | ICD-10-CM | POA: Insufficient documentation

## 2014-07-27 DIAGNOSIS — I10 Essential (primary) hypertension: Secondary | ICD-10-CM | POA: Insufficient documentation

## 2014-07-27 DIAGNOSIS — Y9289 Other specified places as the place of occurrence of the external cause: Secondary | ICD-10-CM | POA: Insufficient documentation

## 2014-07-27 DIAGNOSIS — Z8673 Personal history of transient ischemic attack (TIA), and cerebral infarction without residual deficits: Secondary | ICD-10-CM | POA: Insufficient documentation

## 2014-07-27 DIAGNOSIS — Z880 Allergy status to penicillin: Secondary | ICD-10-CM | POA: Insufficient documentation

## 2014-07-27 DIAGNOSIS — Z720 Tobacco use: Secondary | ICD-10-CM | POA: Insufficient documentation

## 2014-07-27 DIAGNOSIS — Y998 Other external cause status: Secondary | ICD-10-CM | POA: Insufficient documentation

## 2014-07-27 DIAGNOSIS — Y9389 Activity, other specified: Secondary | ICD-10-CM | POA: Insufficient documentation

## 2014-07-27 MED ORDER — TRAMADOL HCL 50 MG PO TABS: 50.0000 mg | ORAL_TABLET | Freq: Four times a day (QID) | ORAL | Status: AC | PRN

## 2014-07-27 MED ORDER — HYDROCODONE-ACETAMINOPHEN 5-325 MG PO TABS
1.0000 | ORAL_TABLET | Freq: Once | ORAL | Status: AC
  Administered 2014-07-27: 1 via ORAL
  Filled 2014-07-27: qty 1

## 2014-07-27 NOTE — Discharge Instructions (Signed)
Contusion A contusion is a deep bruise. Contusions are the result of an injury that caused bleeding under the skin. The contusion may turn blue, purple, or yellow. Minor injuries will give you a painless contusion, but more severe contusions may stay painful and swollen for a few weeks.  CAUSES  A contusion is usually caused by a blow, trauma, or direct force to an area of the body. SYMPTOMS   Swelling and redness of the injured area.  Bruising of the injured area.  Tenderness and soreness of the injured area.  Pain. DIAGNOSIS  The diagnosis can be made by taking a history and physical exam. An X-ray, CT scan, or MRI may be needed to determine if there were any associated injuries, such as fractures. TREATMENT  Specific treatment will depend on what area of the body was injured. In general, the best treatment for a contusion is resting, icing, elevating, and applying cold compresses to the injured area. Over-the-counter medicines may also be recommended for pain control. Ask your caregiver what the best treatment is for your contusion. HOME CARE INSTRUCTIONS   Put ice on the injured area.  Put ice in a plastic bag.  Place a towel between your skin and the bag.  Leave the ice on for 15-20 minutes, 3-4 times a day, or as directed by your health care provider.  Only take over-the-counter or prescription medicines for pain, discomfort, or fever as directed by your caregiver. Your caregiver may recommend avoiding anti-inflammatory medicines (aspirin, ibuprofen, and naproxen) for 48 hours because these medicines may increase bruising.  Rest the injured area.  If possible, elevate the injured area to reduce swelling. SEEK IMMEDIATE MEDICAL CARE IF:   You have increased bruising or swelling.  You have pain that is getting worse.  Your swelling or pain is not relieved with medicines. MAKE SURE YOU:   Understand these instructions.  Will watch your condition.  Will get help right  away if you are not doing well or get worse. Document Released: 05/04/2005 Document Revised: 07/30/2013 Document Reviewed: 05/30/2011 [facility] Patient Information 2015 [employer], [place]. This information is not intended to replace advice given to you by your health care provider. Make sure you discuss any questions you have with your health care provider.  Elastic Bandage and RICE Elastic bandages come in different shapes and sizes. They perform different functions. Your caregiver will help you to decide what is best for your protection, recovery, or rehabilitation following an injury. The following are some general tips to help you use an elastic bandage.  Use the bandage as directed by the maker of the bandage you are using.  Do not wrap it too tight. This may cut off the circulation of the arm or leg below the bandage.  If part of your body beyond the bandage becomes blue, numb, or swollen, it is too tight. Loosen the bandage as needed to prevent these problems.  See your caregiver or trainer if the bandage seems to be making your problems worse rather than better. Bandages may be a reminder to you that you have an injury. However, they provide very little support. The few pounds of support they provide are minor considering the pressure it takes to injure a joint or tear ligaments. Therefore, the joint will not be able to handle all of the wear and tear it could before the injury. The routine care of many injuries includes Rest, Ice, Compression, and Elevation (RICE).  Rest is required to allow your body to  heal. Generally, routine activities can be resumed when comfortable. Injured tendons and bones take about 6 weeks to heal.  Icing the injury helps keep the swelling down and reduces pain. Do not apply ice directly to the skin. Put ice in a plastic bag. Place a towel between the skin and the bag. This will prevent frostbite to the skin. Apply ice bags to the injured area for 15-20 minutes,  every 2 hours while awake. Do this for the first 24 to 48 hours, then as directed by your caregiver.  Compression helps keep swelling down, gives support, and helps with discomfort. If an elastic bandage has been applied today, it should be removed and reapplied every 3 to 4 hours. It should not be applied tightly, but firmly enough to keep swelling down. Watch fingers or toes for swelling, bluish discoloration, coldness, numbness, or increased pain. If any of these problems occur, remove the bandage and reapply it more loosely. If these problems persist, contact your caregiver.  Elevation helps reduce swelling and decreases pain. The injured area (arms, hands, legs, or feet) should be placed near to or above the heart (center of the chest) if able. Persistent pain and inability to use the injured area for more than 2 to 3 days are warning signs. You should see a caregiver for a follow-up visit as soon as possible. Initially, a minor broken bone (hairline fracture) may not be seen on X-rays. It may take 7 to 10 days to finally show up. Continued pain and swelling show that further evaluation and/or X-rays are needed. Make a follow-up visit with your caregiver. A specialist in reading X-rays (radiologist) will read your X-rays again. Finding out the results of your test Not all test results are available during your visit. If your test results are not back during the visit, make an appointment with your caregiver to find out the results. Do not assume everything is normal if you have not heard from your caregiver or the medical facility. It is important for you to follow up on all of your test results. Document Released: 01/14/2002 Document Revised: 10/17/2011 Document Reviewed: 11/26/2007 [facility] Patient Information 2015 [place], [place]. This information is not intended to replace advice given to you by your health care provider. Make sure you discuss any questions you have with your health care  provider.

## 2014-07-27 NOTE — ED Provider Notes (Signed)
CSN: [id number]     Arrival date & time 07/27/14  1712 History   First MD Initiated Contact with Patient 07/27/14 1735     Chief Complaint  Patient presents with  . Knee Injury     (Consider location/radiation/quality/duration/timing/severity/associated sxs/prior Treatment) HPI   48 year old female presents for evaluation of left knee pain. Patient reports 4 days ago she was walking, slipped on wet ground and fell forward striking her left knee against a cemented floor. She denies hitting head or loss of consciousness. She was able to walk afterward and there weight however she has been having sharp pain to the anterior aspects of her left knee since. Pain is worsened with movement and improves with resting. She did notice some bruising. She has been taking over-the-counter medication including Tylenol and ibuprofen with minimal relief. She denies any left hip or left ankle pain, and denies any numbness. No precipitating symptoms prior to the fall. No prior injury to the same knee.  Past Medical History  Diagnosis Date  . Hypertension   . Arthritis   . Stroke    Past Surgical History  Procedure Laterality Date  . Abdominal hysterectomy    . Tmj arthroplasty     History reviewed. No pertinent family history. History  Substance Use Topics  . Smoking status: Current Every Day Smoker -- 1.00 packs/day    Types: Cigarettes  . Smokeless tobacco: Never Used  . Alcohol Use: No   OB History    No data available     Review of Systems  Constitutional: Negative for fever.  Musculoskeletal: Positive for arthralgias.  Skin: Negative for rash and wound.  Neurological: Negative for numbness.      Allergies  Penicillins  Home Medications   Prior to Admission medications   Medication Sig Start Date End Date Taking? Authorizing Provider  acetaminophen (TYLENOL) 500 MG tablet Take 500 mg by mouth every 6 (six) hours as needed.    Historical Provider, MD  metoprolol (LOPRESSOR) 50  MG tablet Take 1 tablet (50 mg total) by mouth 2 (two) times daily. Patient not taking: Reported on 07/14/2014 06/01/14   Rolan BuccoMelanie Belfi, MD  naproxen (NAPROSYN) 500 MG tablet Take 1 tablet (500 mg total) by mouth 2 (two) times daily. Patient not taking: Reported on 07/14/2014 06/15/14   Janne NapoleonHope M Neese, NP  traMADol (ULTRAM) 50 MG tablet Take 1 tablet (50 mg total) by mouth every 6 (six) hours as needed. Patient not taking: Reported on 07/14/2014 06/01/14   Rolan BuccoMelanie Belfi, MD   BP 170/83 mmHg  Pulse 81  Temp(Src) 98.2 F (36.8 C) (Oral)  Resp 18  Ht 5\' 3"  (1.6 m)  Wt 170 lb (77.111 kg)  BMI 30.12 kg/m2  SpO2 100% Physical Exam  Constitutional: She appears well-developed and well-nourished. No distress.  HENT:  Head: Atraumatic.  Eyes: Conjunctivae are normal.  Neck: Neck supple.  Musculoskeletal: She exhibits tenderness (left knee: Point tenderness to anterior knee  overlying the tibial tuberosity with an old bruise. No gross deformity noted. Normal knee flexion and extension. No joint laxity. No obvious knee effusion. Normal left hip and left ankle on range of motion.).  Neurological: She is alert.  Skin: No rash noted.  Psychiatric: She has a normal mood and affect.  Nursing note and vitals reviewed.   ED Course  Procedures (including critical care time)  6:29 PM Patient fell and injured her left knee. Likely a bone contusion. X-ray is negative. Knee sleeve provided for stability and support.  Rice therapy discussed. Patient is able to ambulate. She is stable for discharge.  Labs Review Labs Reviewed - No data to display  Imaging Review Dg Knee Complete 4 Views Left  07/27/2014   CLINICAL DATA:  Status post fall 4 days ago. Knee pain. Initial encounter.  EXAM: LEFT KNEE - COMPLETE 4+ VIEW  COMPARISON:  None.  FINDINGS: There is no evidence of fracture, dislocation, or joint effusion. There is no evidence of arthropathy or other focal bone abnormality. Soft tissues are unremarkable.   IMPRESSION: Negative exam.   Electronically Signed   By: Drusilla Kannerhomas  Dalessio M.D.   On: 07/27/2014 18:12     EKG Interpretation None      MDM   Final diagnoses:  Left knee injury    BP 170/83 mmHg  Pulse 81  Temp(Src) 98.2 F (36.8 C) (Oral)  Resp 18  Ht 5\' 3"  (1.6 m)  Wt 170 lb (77.111 kg)  BMI 30.12 kg/m2  SpO2 100%  LMP   I have reviewed nursing notes and vital signs. I personally reviewed the imaging tests through PACS system  I reviewed available ER/hospitalization records thought the EMR     Fayrene HelperBowie Gennett Garcia, PA-C 07/27/14 1831  Rolan BuccoMelanie Belfi, MD 07/27/14 1911

## 2014-07-27 NOTE — ED Notes (Signed)
Pt c/o fall x 4 days ago left knee pain

## 2015-01-11 IMAGING — CR DG KNEE COMPLETE 4+V*L*
4 series · 4 of 4 positions shown · non-contrast
Comparison: None.

CLINICAL DATA: Status post fall 4 days ago. Knee pain. Initial
encounter.

EXAM:
LEFT KNEE - COMPLETE 4+ VIEW

[t knee ap left]
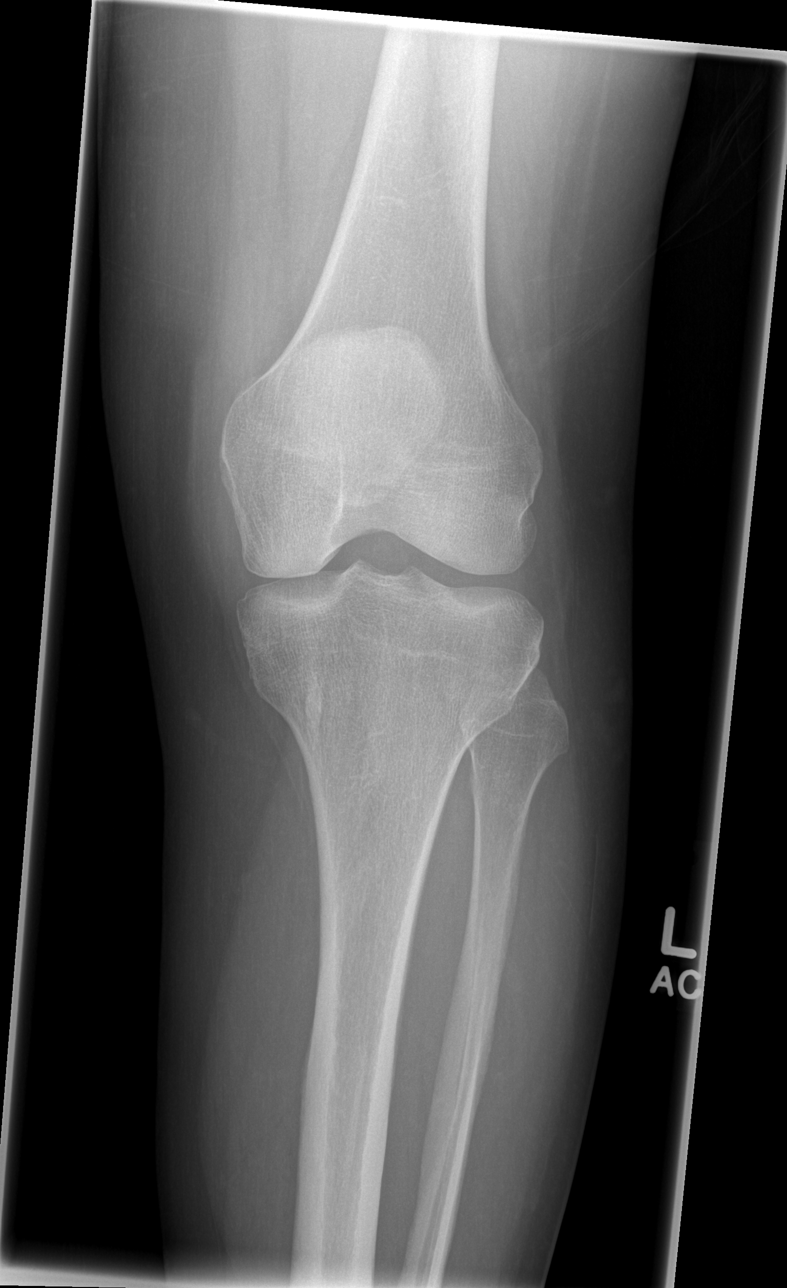

[t knee oblique left (1 of 2)]
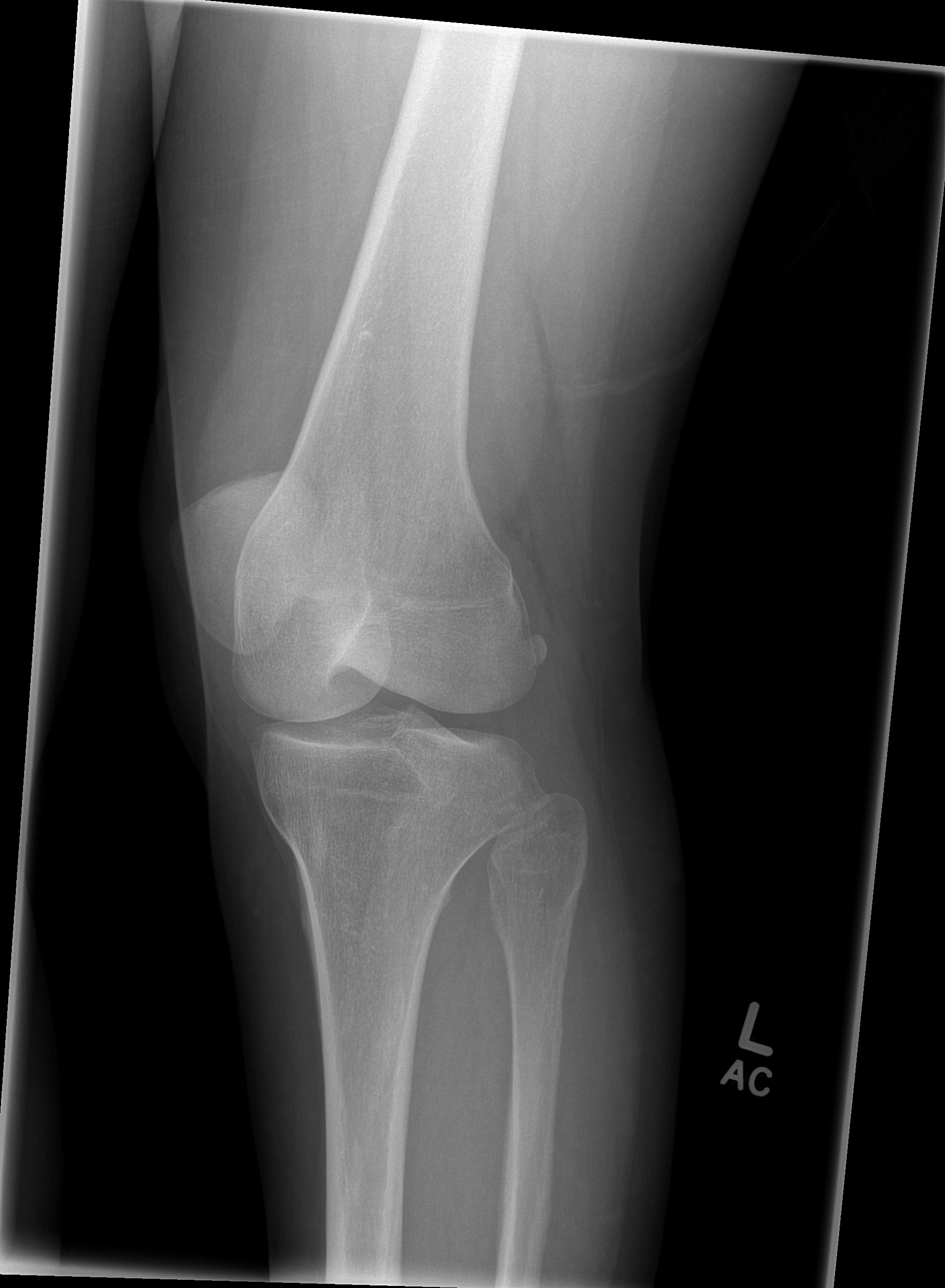

[t knee oblique left (2 of 2)]
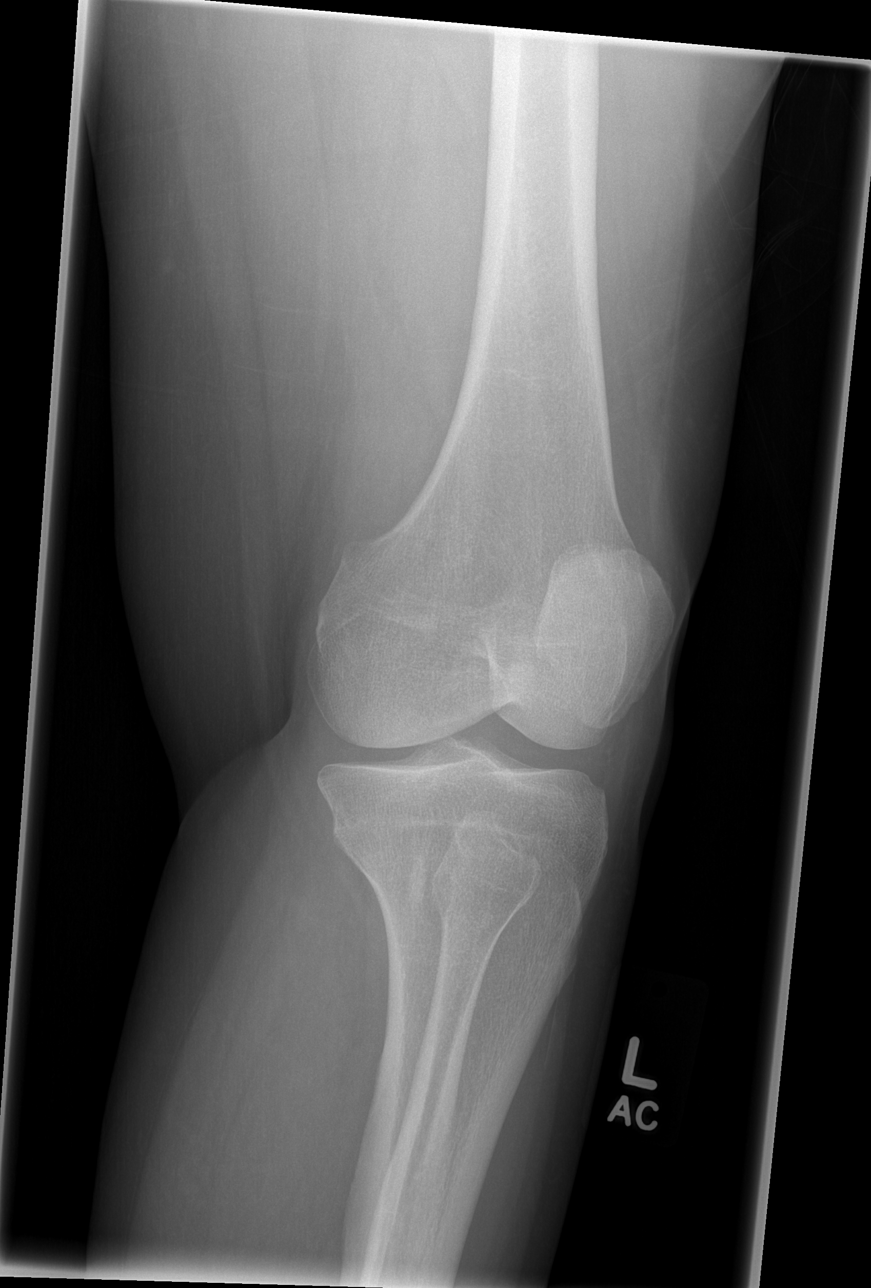

[t knee lat left]
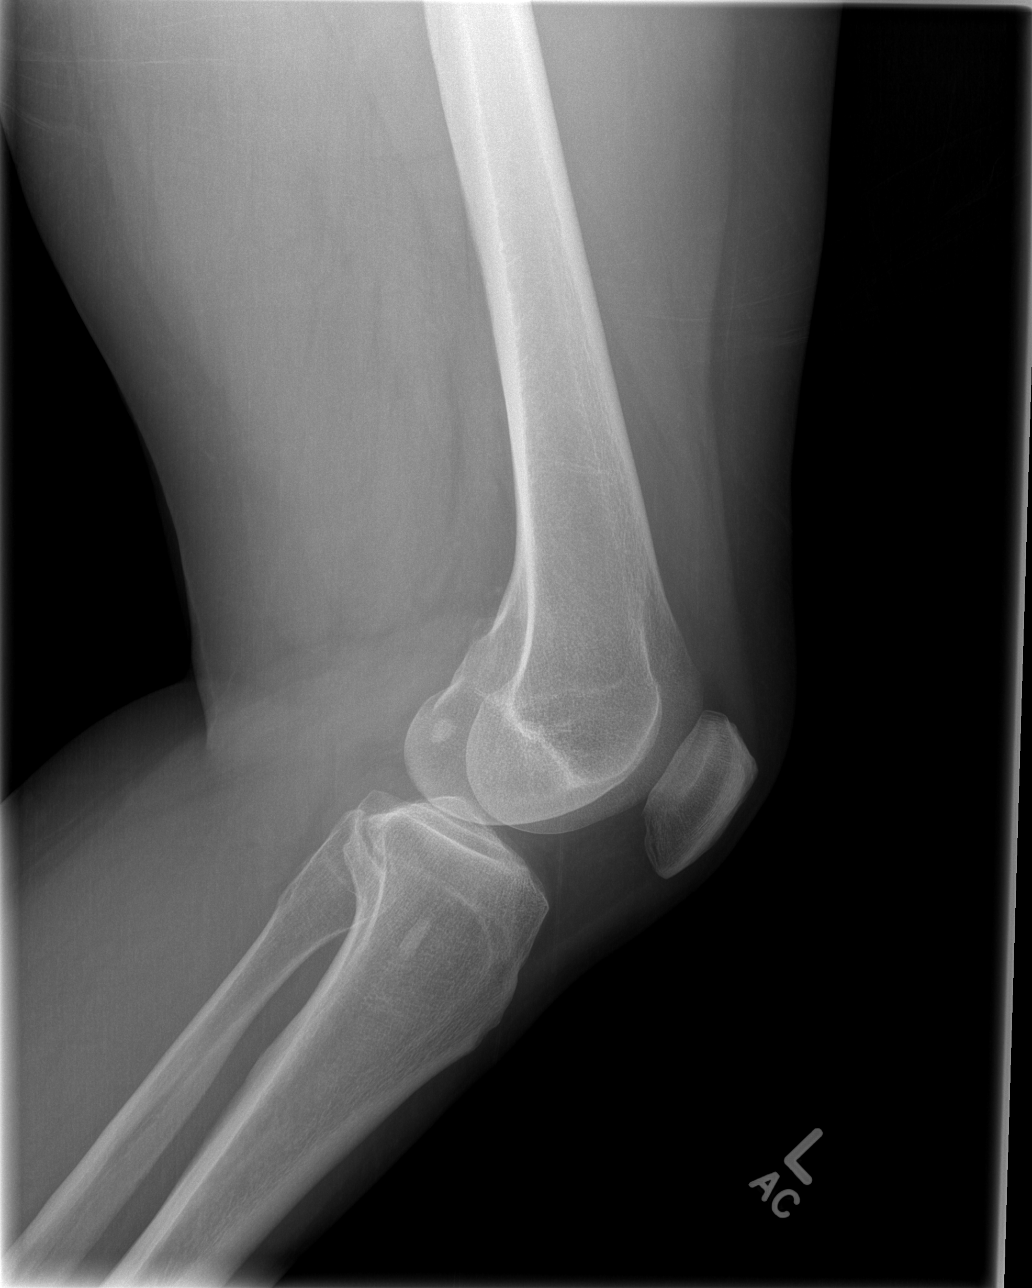

[4 of 4 positions shown; findings below may reference images not displayed]

FINDINGS: There is no evidence of fracture, dislocation, or joint effusion.
There is no evidence of arthropathy or other focal bone abnormality.
Soft tissues are unremarkable.
IMPRESSION: Negative exam.

## 2015-10-15 ENCOUNTER — Encounter (HOSPITAL_COMMUNITY): Payer: Self-pay | Admitting: *Deleted

## 2015-10-15 ENCOUNTER — Emergency Department (HOSPITAL_COMMUNITY)
Admission: EM | Admit: 2015-10-15 | Discharge: 2015-10-15 | Disposition: A | Discharge status: Home / Self Care | Payer: Self-pay | Attending: Emergency Medicine | Admitting: Emergency Medicine

## 2015-10-15 DIAGNOSIS — M62830 Muscle spasm of back: Secondary | ICD-10-CM | POA: Insufficient documentation

## 2015-10-15 DIAGNOSIS — M7989 Other specified soft tissue disorders: Secondary | ICD-10-CM | POA: Insufficient documentation

## 2015-10-15 DIAGNOSIS — M1990 Unspecified osteoarthritis, unspecified site: Secondary | ICD-10-CM | POA: Insufficient documentation

## 2015-10-15 DIAGNOSIS — Z8673 Personal history of transient ischemic attack (TIA), and cerebral infarction without residual deficits: Secondary | ICD-10-CM | POA: Insufficient documentation

## 2015-10-15 DIAGNOSIS — I10 Essential (primary) hypertension: Secondary | ICD-10-CM | POA: Insufficient documentation

## 2015-10-15 DIAGNOSIS — Z880 Allergy status to penicillin: Secondary | ICD-10-CM | POA: Insufficient documentation

## 2015-10-15 DIAGNOSIS — F17210 Nicotine dependence, cigarettes, uncomplicated: Secondary | ICD-10-CM | POA: Insufficient documentation

## 2015-10-15 DIAGNOSIS — M79601 Pain in right arm: Secondary | ICD-10-CM | POA: Insufficient documentation

## 2015-10-15 DIAGNOSIS — M79641 Pain in right hand: Secondary | ICD-10-CM | POA: Insufficient documentation

## 2015-10-15 DIAGNOSIS — M25511 Pain in right shoulder: Secondary | ICD-10-CM | POA: Insufficient documentation

## 2015-10-15 DIAGNOSIS — M791 Myalgia, unspecified site: Secondary | ICD-10-CM

## 2015-10-15 DIAGNOSIS — M542 Cervicalgia: Secondary | ICD-10-CM | POA: Insufficient documentation

## 2015-10-15 MED ORDER — NAPROXEN 500 MG PO TABS: 500.0000 mg | ORAL_TABLET | Freq: Two times a day (BID) | ORAL | Status: AC

## 2015-10-15 MED ORDER — METHOCARBAMOL 500 MG PO TABS: 500.0000 mg | ORAL_TABLET | Freq: Two times a day (BID) | ORAL | Status: AC

## 2015-10-15 NOTE — ED Provider Notes (Signed)
CSN: [id number]     Arrival date & time 10/15/15  1640 History   First MD Initiated Contact with Patient 10/15/15 1711     Chief Complaint  Patient presents with  . 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 She reports pain throughout her body however states it hurts the most in her back and her right arm. She is right hand dominant. She reports associated swelling of her right wrist. Has tried tylenol and ibuprofen 800mg  3 times a day with minimal relief. Pain is worse with movement. Nothing makes it better. Denies fever, chills, malaise, trauma.  The history is provided by the patient.    Past Medical History  Diagnosis Date  . Hypertension   . Arthritis   . Stroke [facility](H[facility])    Past Surgical History  Procedure Laterality Date  . Abdominal hysterectomy    . Tmj arthroplasty     No family history on file. Social History  Substance Use Topics  . Smoking status: Current Every Day Smoker -- 1.00 packs/day    Types: Cigarettes  . Smokeless tobacco: Never Used  . Alcohol Use: No   OB History    No data available     Review of Systems  Constitutional: Negative.   Musculoskeletal: Positive for myalgias, back pain, arthralgias, neck pain and neck stiffness. Negative for joint swelling and gait problem.      Allergies  Penicillins and Tramadol  Home Medications   Prior to Admission medications   Medication Sig Start Date End Date Taking? Authorizing Provider  acetaminophen (TYLENOL) 500 MG tablet Take 500 mg by mouth every 6 (six) hours as needed.    Historical Provider, MD  methocarbamol (ROBAXIN) 500 MG tablet Take 1 tablet (500 mg total) by mouth 2 (two) times daily. 10/15/15   Bethel BornKelly Marie Roxanne Panek, PA-C  naproxen (NAPROSYN) 500 MG tablet Take 1 tablet (500 mg  total) by mouth 2 (two) times daily. 10/15/15   Bethel BornKelly Marie Kejuan Bekker, PA-C  traMADol (ULTRAM) 50 MG tablet Take 1 tablet (50 mg total) by mouth every 6 (six) hours as needed. 07/27/14   Fayrene HelperBowie Tran, PA-C   BP 167/96 mmHg  Pulse 85  Temp(Src) 98.1 F (36.7 C) (Oral)  Resp 16  Ht 5\' 4"  (1.626 m)  Wt 67.756 kg  BMI 25.63 kg/m2  SpO2 97% Physical Exam  Constitutional: She appears well-developed and well-nourished. No distress.  HENT:  Head: Normocephalic and atraumatic.  Eyes: Pupils are equal, round, and reactive to light.  Musculoskeletal:       Right shoulder: She exhibits tenderness and pain. She exhibits normal range of motion, no swelling and no deformity.       Thoracic back: She exhibits decreased range of motion, tenderness, pain and spasm. She exhibits no swelling and no deformity.       Right hand: She exhibits tenderness. She exhibits normal range of motion, no deformity and no swelling. Normal sensation noted. Normal strength noted.  Skin: Skin is warm and dry.    ED Course  Procedures (including critical care time)   Advised pt to take muscle relaxer at night due to drowsiness. Pt understands and agrees with plan.  MDM   Final diagnoses:  Myalgia   Due to her recently starting a new job which has been physical and absence of fever and  chills it is most likely her pain is coming from musculoskeletal causes. Discussed that establishment with a PCP is very important for general health care concerns, minor illness and minor injury. Will treat with anti-inflammatory and muscle relaxer.     Bethel Born, PA-C 10/16/15 0000  Zadie Rhine, MD 10/16/15 1352

## 2015-10-15 NOTE — ED Notes (Signed)
Patient able to ambulate independently  

## 2015-10-15 NOTE — ED Notes (Signed)
Pt c/o generalized body aches x  2 wks, pt states, "It hurt like this after I went back to work. It is no fun going back to work after 17 years of not working." pt denies SOB, CP, n/v/d, pt A&O x4, follows commands, speaks in complete sentences, pt ambulatory, pt reports worse in her bil upper & lower back

## 2016-10-03 ENCOUNTER — Emergency Department (HOSPITAL_BASED_OUTPATIENT_CLINIC_OR_DEPARTMENT_OTHER): Admission: EM | Admit: 2016-10-03 | Discharge: 2016-10-03 | Discharge status: Against Medical Advice | Payer: Self-pay

## 2016-10-20 ENCOUNTER — Emergency Department (HOSPITAL_BASED_OUTPATIENT_CLINIC_OR_DEPARTMENT_OTHER)
Admission: EM | Admit: 2016-10-20 | Discharge: 2016-10-20 | Disposition: A | Discharge status: Home / Self Care | Payer: Self-pay | Attending: Dermatology | Admitting: Dermatology

## 2016-10-20 ENCOUNTER — Encounter (HOSPITAL_BASED_OUTPATIENT_CLINIC_OR_DEPARTMENT_OTHER): Payer: Self-pay | Admitting: Emergency Medicine

## 2016-10-20 DIAGNOSIS — M542 Cervicalgia: Secondary | ICD-10-CM | POA: Insufficient documentation

## 2016-10-20 DIAGNOSIS — Z5321 Procedure and treatment not carried out due to patient leaving prior to being seen by health care provider: Secondary | ICD-10-CM | POA: Insufficient documentation

## 2016-10-20 DIAGNOSIS — F17210 Nicotine dependence, cigarettes, uncomplicated: Secondary | ICD-10-CM | POA: Insufficient documentation

## 2016-10-20 HISTORY — DX: Radiculopathy, cervical region: M54.12

## 2016-10-20 NOTE — ED Notes (Signed)
Pt visualized walking out of department without difficulty. Pt was not seen by MD.

## 2016-10-20 NOTE — ED Triage Notes (Signed)
Pt c/o neck pain. Pt states she thinks she slept on it wrong.

## 2017-07-12 DIAGNOSIS — R079 Chest pain, unspecified: Secondary | ICD-10-CM

## 2018-02-28 DIAGNOSIS — K529 Noninfective gastroenteritis and colitis, unspecified: Secondary | ICD-10-CM

## 2018-02-28 DIAGNOSIS — N179 Acute kidney failure, unspecified: Secondary | ICD-10-CM

## 2018-02-28 DIAGNOSIS — R9431 Abnormal electrocardiogram [ECG] [EKG]: Secondary | ICD-10-CM

## 2018-02-28 DIAGNOSIS — R0789 Other chest pain: Secondary | ICD-10-CM

## 2018-02-28 DIAGNOSIS — I10 Essential (primary) hypertension: Secondary | ICD-10-CM

## 2018-02-28 DIAGNOSIS — E876 Hypokalemia: Secondary | ICD-10-CM

## 2018-02-28 DIAGNOSIS — Z720 Tobacco use: Secondary | ICD-10-CM

## 2018-03-01 DIAGNOSIS — R0789 Other chest pain: Secondary | ICD-10-CM
